# Patient Record
Sex: Male | Born: 1965 | Race: White | Hispanic: No | Marital: Single | Smoking: Never smoker
Health system: Southern US, Community
[De-identification: ages and names within clinical notes are randomized; demographics above are authoritative.]

## PROBLEM LIST (undated history)

## (undated) DIAGNOSIS — E785 Hyperlipidemia, unspecified: Secondary | ICD-10-CM

## (undated) HISTORY — PX: TONSILLECTOMY: SUR1361

## (undated) HISTORY — PX: COLONOSCOPY: SHX174

## (undated) HISTORY — DX: Hyperlipidemia, unspecified: E78.5

---

## 2003-02-02 ENCOUNTER — Emergency Department (HOSPITAL_COMMUNITY): Admission: EM | Admit: 2003-02-02 | Discharge: 2003-02-03 | Payer: Self-pay | Admitting: Emergency Medicine

## 2003-02-03 ENCOUNTER — Encounter: Payer: Self-pay | Admitting: Emergency Medicine

## 2019-01-28 ENCOUNTER — Other Ambulatory Visit: Payer: Self-pay

## 2019-01-29 LAB — URINALYSIS, ROUTINE W REFLEX MICROSCOPIC
Bilirubin, UA: NEGATIVE
Glucose, UA: NEGATIVE
Ketones, UA: NEGATIVE
Leukocytes,UA: NEGATIVE
Nitrite, UA: NEGATIVE
Protein,UA: NEGATIVE
RBC, UA: NEGATIVE
Specific Gravity, UA: <=1.005 — AB (ref 1.005–1.030)
Urobilinogen, Ur: 0.2 mg/dL (ref 0.2–1.0)
pH, UA: 7 (ref 5.0–7.5)

## 2019-01-30 LAB — CBC WITH DIFFERENTIAL/PLATELET
Basophils Absolute: 0.1 10*3/uL (ref 0.0–0.2)
Basos: 1 %
EOS (ABSOLUTE): 0 10*3/uL (ref 0.0–0.4)
Eos: 0 %
Hematocrit: 49.8 % (ref 37.5–51.0)
Hemoglobin: 17.1 g/dL (ref 13.0–17.7)
Immature Grans (Abs): 0 10*3/uL (ref 0.0–0.1)
Immature Granulocytes: 0 %
Lymphocytes Absolute: 2 10*3/uL (ref 0.7–3.1)
Lymphs: 23 %
MCH: 28.5 pg (ref 26.6–33.0)
MCHC: 34.3 g/dL (ref 31.5–35.7)
MCV: 83 fL (ref 79–97)
Monocytes Absolute: 0.8 10*3/uL (ref 0.1–0.9)
Monocytes: 9 %
Neutrophils Absolute: 6 10*3/uL (ref 1.4–7.0)
Neutrophils: 67 %
Platelets: 210 10*3/uL (ref 150–450)
RBC: 6 x10E6/uL — ABNORMAL HIGH (ref 4.14–5.80)
RDW: 13 % (ref 11.6–15.4)
WBC: 9 10*3/uL (ref 3.4–10.8)

## 2019-01-30 LAB — ABO/RH: Rh Factor: NEGATIVE

## 2019-01-30 LAB — COMPREHENSIVE METABOLIC PANEL
ALT: 20 IU/L (ref 0–44)
AST: 23 IU/L (ref 0–40)
Albumin/Globulin Ratio: 2.7 — ABNORMAL HIGH (ref 1.2–2.2)
Albumin: 4.8 g/dL (ref 3.8–4.9)
Alkaline Phosphatase: 58 IU/L (ref 39–117)
BUN/Creatinine Ratio: 18 (ref 9–20)
BUN: 15 mg/dL (ref 6–24)
Bilirubin Total: 1 mg/dL (ref 0.0–1.2)
CO2: 23 mmol/L (ref 20–29)
Calcium: 9.1 mg/dL (ref 8.7–10.2)
Chloride: 94 mmol/L — ABNORMAL LOW (ref 96–106)
Creatinine, Ser: 0.85 mg/dL (ref 0.76–1.27)
GFR calc Af Amer: 117 mL/min/{1.73_m2} (ref >59)
GFR calc non Af Amer: 101 mL/min/{1.73_m2} (ref >59)
Globulin, Total: 1.8 g/dL (ref 1.5–4.5)
Glucose: 87 mg/dL (ref 65–99)
Potassium: 4.3 mmol/L (ref 3.5–5.2)
Sodium: 134 mmol/L (ref 134–144)
Total Protein: 6.6 g/dL (ref 6.0–8.5)

## 2019-01-30 LAB — TSH: TSH: 1.47 u[IU]/mL (ref 0.450–4.500)

## 2019-01-30 LAB — HGB A1C W/O EAG: Hgb A1c MFr Bld: 5.1 % (ref 4.8–5.6)

## 2019-01-30 LAB — LIPID PANEL WITH LDL/HDL RATIO
Cholesterol, Total: 186 mg/dL (ref 100–199)
HDL: 46 mg/dL (ref >39)
LDL Calculated: 130 mg/dL — ABNORMAL HIGH (ref 0–99)
LDl/HDL Ratio: 2.8 ratio (ref 0.0–3.6)
Triglycerides: 52 mg/dL (ref 0–149)
VLDL Cholesterol Cal: 10 mg/dL (ref 5–40)

## 2019-01-30 LAB — VITAMIN D 25 HYDROXY (VIT D DEFICIENCY, FRACTURES): Vit D, 25-Hydroxy: 33.9 ng/mL (ref 30.0–100.0)

## 2019-01-30 LAB — ANA: Anti Nuclear Antibody (ANA): NEGATIVE

## 2019-01-30 LAB — RHEUMATOID FACTOR: Rhuematoid fact SerPl-aCnc: <10 IU/mL (ref 0.0–13.9)

## 2019-01-30 LAB — SEDIMENTATION RATE: Sed Rate: 2 mm/hr (ref 0–30)

## 2019-01-30 LAB — TESTOSTERONE: Testosterone: 302 ng/dL (ref 264–916)

## 2019-01-30 LAB — PSA: Prostate Specific Ag, Serum: 0.8 ng/mL (ref 0.0–4.0)

## 2021-02-02 ENCOUNTER — Other Ambulatory Visit (HOSPITAL_BASED_OUTPATIENT_CLINIC_OR_DEPARTMENT_OTHER): Payer: Self-pay | Admitting: Internal Medicine

## 2021-02-02 ENCOUNTER — Other Ambulatory Visit: Payer: Self-pay | Admitting: Internal Medicine

## 2021-02-02 DIAGNOSIS — R3129 Other microscopic hematuria: Secondary | ICD-10-CM

## 2021-02-15 ENCOUNTER — Other Ambulatory Visit: Payer: Self-pay

## 2021-02-15 ENCOUNTER — Ambulatory Visit
Admission: RE | Admit: 2021-02-15 | Discharge: 2021-02-15 | Disposition: A | Discharge status: Home / Self Care | Payer: BC Managed Care – PPO | Source: Ambulatory Visit | Attending: Internal Medicine | Admitting: Internal Medicine

## 2021-02-15 DIAGNOSIS — R3129 Other microscopic hematuria: Secondary | ICD-10-CM | POA: Diagnosis not present

## 2021-02-22 ENCOUNTER — Ambulatory Visit
Admission: RE | Admit: 2021-02-22 | Discharge: 2021-02-22 | Disposition: A | Discharge status: Home / Self Care | Payer: BC Managed Care – PPO | Source: Ambulatory Visit | Attending: Urology | Admitting: Urology

## 2021-02-22 ENCOUNTER — Other Ambulatory Visit: Payer: Self-pay

## 2021-02-22 ENCOUNTER — Encounter: Payer: Self-pay | Admitting: Urology

## 2021-02-22 ENCOUNTER — Ambulatory Visit (INDEPENDENT_AMBULATORY_CARE_PROVIDER_SITE_OTHER): Payer: BC Managed Care – PPO | Admitting: Urology

## 2021-02-22 VITALS — BP 156/96 | HR 61 | Ht 69.0 in | Wt 180.0 lb

## 2021-02-22 DIAGNOSIS — N200 Calculus of kidney: Secondary | ICD-10-CM | POA: Diagnosis present

## 2021-02-22 DIAGNOSIS — R3129 Other microscopic hematuria: Secondary | ICD-10-CM

## 2021-02-22 LAB — URINALYSIS, COMPLETE
Bilirubin, UA: NEGATIVE
Glucose, UA: NEGATIVE
Ketones, UA: NEGATIVE
Leukocytes,UA: NEGATIVE
Nitrite, UA: NEGATIVE
Protein,UA: NEGATIVE
Specific Gravity, UA: <1.005 — ABNORMAL LOW (ref 1.005–1.030)
Urobilinogen, Ur: 0.2 mg/dL (ref 0.2–1.0)
pH, UA: 6.5 (ref 5.0–7.5)

## 2021-02-22 LAB — MICROSCOPIC EXAMINATION
Bacteria, UA: NONE SEEN
Epithelial Cells (non renal): NONE SEEN /hpf (ref 0–10)

## 2021-02-22 NOTE — Patient Instructions (Signed)
Cystoscopy Cystoscopy is a procedure that is used to help diagnose and sometimes treat conditions that affect the lower urinary tract. The lower urinary tract includes the bladder and the urethra. The urethra is the tube that drains urine from the bladder. Cystoscopy is done using a thin, tube-shaped instrument with a light and camera at the end (cystoscope). The cystoscope may be hard or flexible, depending on the goal of the procedure. The cystoscope is inserted through the urethra, into the bladder. Cystoscopy may be recommended if you have: Urinary tract infections that keep coming back. Blood in the urine (hematuria). An inability to control when you urinate (urinary incontinence) or an overactive bladder. Unusual cells found in a urine sample. A blockage in the urethra, such as a urinary stone. Painful urination. An abnormality in the bladder found during an intravenous pyelogram (IVP) or CT scan. Cystoscopy may also be done to remove a sample of tissue to be examined under a microscope (biopsy). What are the risks? Generally, this is a safe procedure. However, problems may occur, including: Infection. Bleeding.  What happens during the procedure?  You will be given one or more of the following: A medicine to numb the area (local anesthetic). The area around the opening of your urethra will be cleaned. The cystoscope will be passed through your urethra into your bladder. Germ-free (sterile) fluid will flow through the cystoscope to fill your bladder. The fluid will stretch your bladder so that your health care provider can clearly examine your bladder walls. Your doctor will look at the urethra and bladder. The cystoscope will be removed The procedure may vary among health care providers  What can I expect after the procedure? After the procedure, it is common to have: Some soreness or pain in your abdomen and urethra. Urinary symptoms. These include: Mild pain or burning when you  urinate. Pain should stop within a few minutes after you urinate. This may last for up to 1 week. A small amount of blood in your urine for several days. Feeling like you need to urinate but producing only a small amount of urine. Follow these instructions at home: General instructions Return to your normal activities as told by your health care provider.  Do not drive for 24 hours if you were given a sedative during your procedure. Watch for any blood in your urine. If the amount of blood in your urine increases, call your health care provider. If a tissue sample was removed for testing (biopsy) during your procedure, it is up to you to get your test results. Ask your health care provider, or the department that is doing the test, when your results will be ready. Drink enough fluid to keep your urine pale yellow. Keep all follow-up visits as told by your health care provider. This is important. Contact a health care provider if you: Have pain that gets worse or does not get better with medicine, especially pain when you urinate. Have trouble urinating. Have more blood in your urine. Get help right away if you: Have blood clots in your urine. Have abdominal pain. Have a fever or chills. Are unable to urinate. Summary Cystoscopy is a procedure that is used to help diagnose and sometimes treat conditions that affect the lower urinary tract. Cystoscopy is done using a thin, tube-shaped instrument with a light and camera at the end. After the procedure, it is common to have some soreness or pain in your abdomen and urethra. Watch for any blood in your urine.   If the amount of blood in your urine increases, call your health care provider. If you were prescribed an antibiotic medicine, take it as told by your health care provider. Do not stop taking the antibiotic even if you start to feel better. This information is not intended to replace advice given to you by your health care provider. Make  sure you discuss any questions you have with your health care provider. Document Revised: 05/20/2018 Document Reviewed: 05/20/2018 Elsevier Patient Education  2020 [employer]  

## 2021-02-22 NOTE — Progress Notes (Signed)
02/24/21 2:11 PM   Craig Acosta 12-Mar-1966 [id number]  Referring provider:  Marguarite Arbour, MD [address] [facility] [place],  [place] [address] Chief Complaint  Patient presents with   Nephrolithiasis    New Patient     HPI: Craig Acosta is a 55 y.o.male who presents today for further evaluation of calculus of kidney.   He saw his PCP last month and there was noted mild hematuria, incidental. His last urinalysis on 01/23/2021 showed moderate blood with 10-50 RBCs.   On 02/15/2021 he received a RUS due to his microscopic hematuria. The RUS revealed a nonobstructive right upper pole renal calculi, largest measured up to 6 mm with no hydronephrosis.   Urine today shows no blood.    He reports today that he has no urinary problems and has not seen blood in urine. He does not have a past history of kidney stones he states that he has adequate water intake.   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   He has a family history of prostate cancer.   PMH: Past Medical History:  Diagnosis Date   Hyperlipidemia     Surgical History: Past Surgical History:  Procedure Laterality Date   COLONOSCOPY     TONSILLECTOMY      Home Medications:  Allergies as of 02/22/2021   No Known Allergies      Medication List        Accurate as of February 22, 2021 11:59 PM. If you have any questions, ask your nurse or doctor.          D3-1000 PO Take by mouth.   Fish Oil 1000 MG Cpdr Take by mouth.   PROBIOTIC-10 ULTIMATE PO Take by mouth.        Allergies: No Known Allergies  Family History: History reviewed. No pertinent family history.  Social History:  reports that he has never smoked. He has never used smokeless tobacco. He reports that he does not currently use alcohol. No history on file for drug use.   Physical Exam: BP (!) 156/96   Pulse 61   Ht 5\' 9"  (1.753 m)   Wt  180 lb (81.6 kg)   BMI 26.58 kg/m   Constitutional:  Alert and oriented, No acute distress. HEENT: [place] AT, moist mucus membranes.  Trachea midline, no masses. Cardiovascular: No clubbing, cyanosis, or edema. Respiratory: Normal respiratory effort, no increased work of breathing. Skin: No rashes, bruises or suspicious lesions. Neurologic: Grossly intact, no focal deficits, moving all 4 extremities. Psychiatric: Normal mood and affect.  Laboratory Data:  Lab Results  Component Value Date   CREATININE 0.85 01/28/2019     Lab Results  Component Value Date   TESTOSTERONE 302 01/28/2019    Lab Results  Component Value Date   HGBA1C 5.1 01/28/2019    Urinalysis - Unremarkable with no blood today   Pertinent Imaging: CLINICAL DATA:  Microscopic hematuria   EXAM: RENAL / URINARY TRACT ULTRASOUND COMPLETE   COMPARISON:  None.   FINDINGS: Right Kidney:   Renal measurements: 10.3 x 5.5 x 5.3 cm = volume: 157.2 mL. No hydronephrosis. There are multiple shadowing renal calculi upper pole, largest measuring 6 mm.   Left Kidney:   Renal measurements: 10.1 x 5.4 x 5.6 cm = volume: 159.1 mL. No hydronephrosis or nephrolithiasis.   Bladder:   Appears normal for degree of bladder distention. Bilateral  ureteral jets are seen.   Other:   None.   IMPRESSION: Nonobstructive right upper pole renal calculi, largest measuring up to 6 mm. No hydronephrosis.     Electronically Signed   By: Caprice Renshaw M.D.   On: 02/17/2021 17:17   I have personally reviewed the images and agree with radiologist interpretation.    Assessment & Plan:   Kidney stones  - asymptomatic  - nonobstructive, multiple  - KUB ordered to better understand stone size and location to see if he needs any further intervention - offered treatment versus surveillance    2. Microscopic hematuria  - urinalysis at primary care 10-50 RBCs  - urine today had no blood  - Typically do not see microscopic  blood with nonobstructive stones would recommend cystoscopy. He is agreeable with this plan  Return for Will call with results.  Tawni Millers as a Neurosurgeon for Craig Scotland, MD.,have documented all relevant documentation on the behalf of Craig Scotland, MD,as directed by  Craig Scotland, MD while in the presence of Craig Scotland, MD.  I have reviewed the above documentation for accuracy and completeness, and I agree with the above.   Craig Scotland, MD   [facility] [address], [address] [place], [place] [address] [phone]

## 2021-02-23 ENCOUNTER — Telehealth: Payer: Self-pay

## 2021-02-23 NOTE — Telephone Encounter (Signed)
Pt returned your call and wants you to call him back [phone]

## 2021-02-23 NOTE — Telephone Encounter (Signed)
Pt LMOM that he wants to get his cysto done under anesthesia.  He said if he needs to cancel current appt to schedule surgery, he could do that.

## 2021-02-23 NOTE — Telephone Encounter (Signed)
Returned patients called regarding VM left stating pt had questions about cystoscopy. I left pt vm to return my call

## 2021-02-23 NOTE — Telephone Encounter (Signed)
-----   Message from [name], MD sent at 02/23/2021  1:37 PM EDT ----- No stones on this x ray.  The stones from the ultrasound are not seen.  Either they don't exist or are obscured by bowel.  We can talk about whether to do any further imaging at your follow up.  [name], MD

## 2021-02-24 ENCOUNTER — Encounter: Payer: Self-pay | Admitting: Urology

## 2021-02-24 MED ORDER — DIAZEPAM 10 MG PO TABS: 10.0000 mg | ORAL_TABLET | Freq: Once | ORAL | 0 refills | Status: AC

## 2021-02-24 NOTE — Addendum Note (Signed)
Addended by: [name] on: 02/24/2021 02:15 PM   Modules accepted: Orders

## 2021-02-24 NOTE — Telephone Encounter (Signed)
Please let him know that we can offer him a valium in the office if he has a drive to reduce anxiety.  I sent it to the pharmacy.   Cysto in the OR $$$$.    [name], MD

## 2021-02-24 NOTE — Telephone Encounter (Signed)
Sw pt. OK with taking valium. I explained procedure to patient and assured him . Pt verbalized understanding and ok with plan

## 2021-03-16 ENCOUNTER — Other Ambulatory Visit: Payer: BC Managed Care – PPO | Admitting: Urology

## 2022-08-09 IMAGING — US US RENAL
1 series · 14 of 25 positions shown · non-contrast
Comparison: None.

CLINICAL DATA: Microscopic hematuria

EXAM:
RENAL / URINARY TRACT ULTRASOUND COMPLETE

[Series 1: us renal · 0.23mm/px · 14 of 42 slices shown]
[im 1/42]
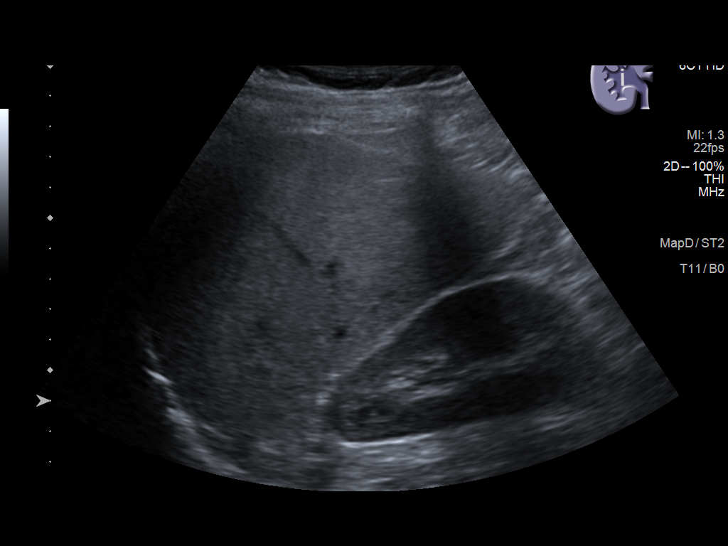
[im 4/42]
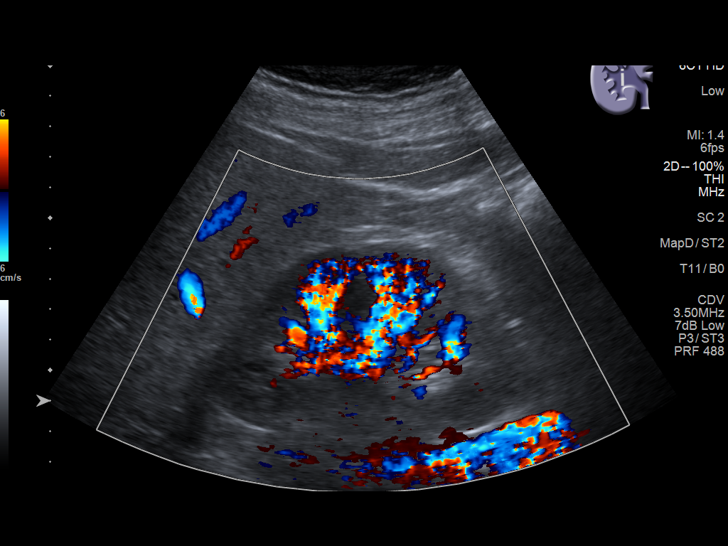
[im 7/42]
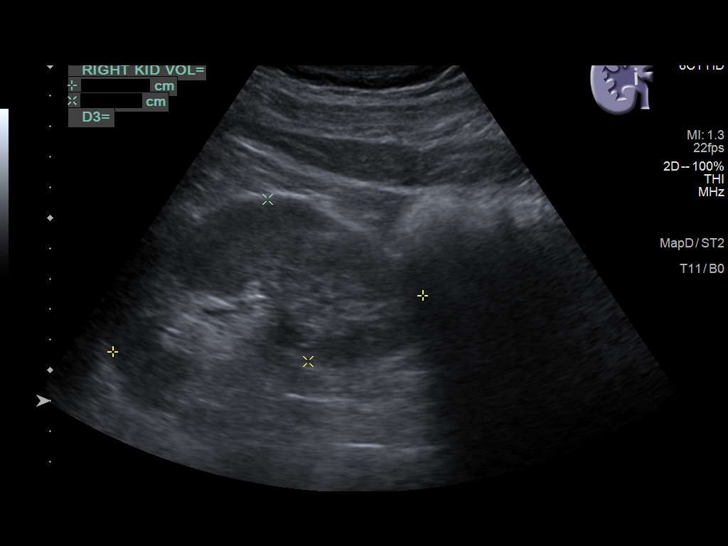
[im 11/42]
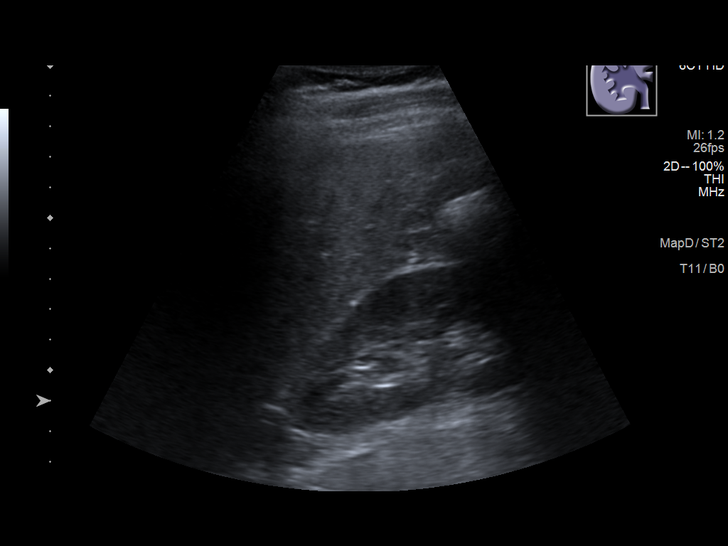
[im 14/42]
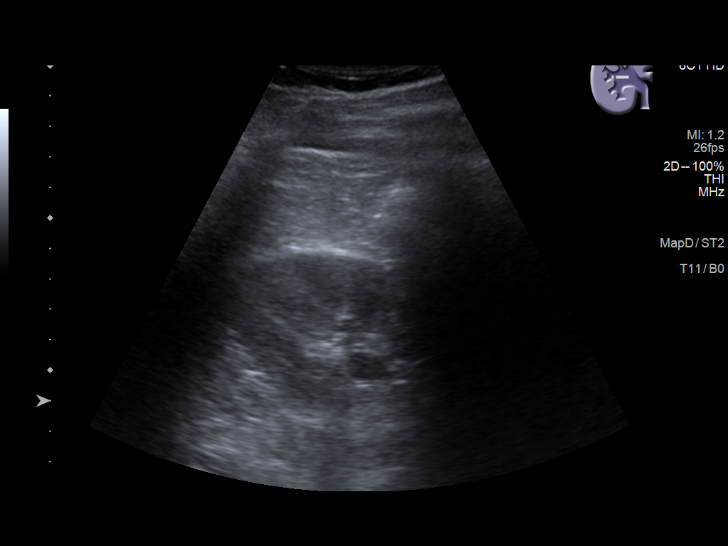
[im 16/42]
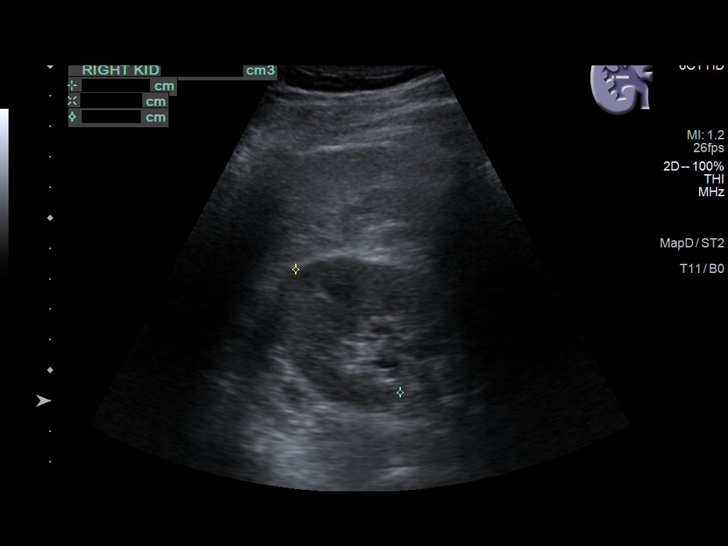
[im 19/42]
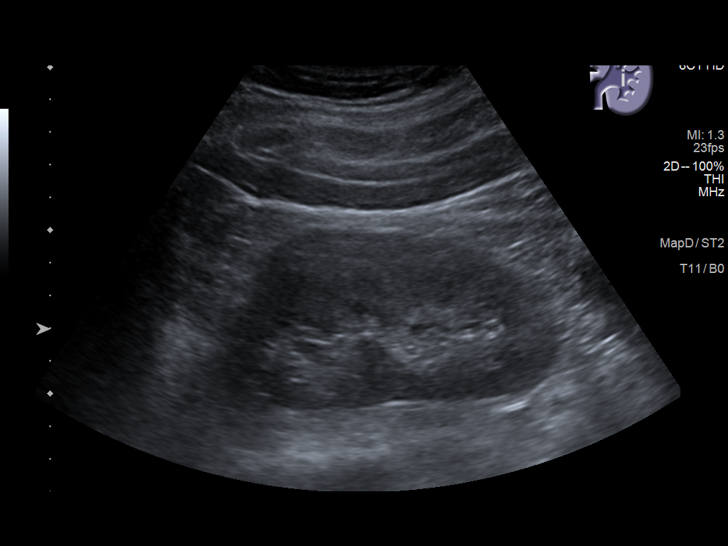
[im 23/42]
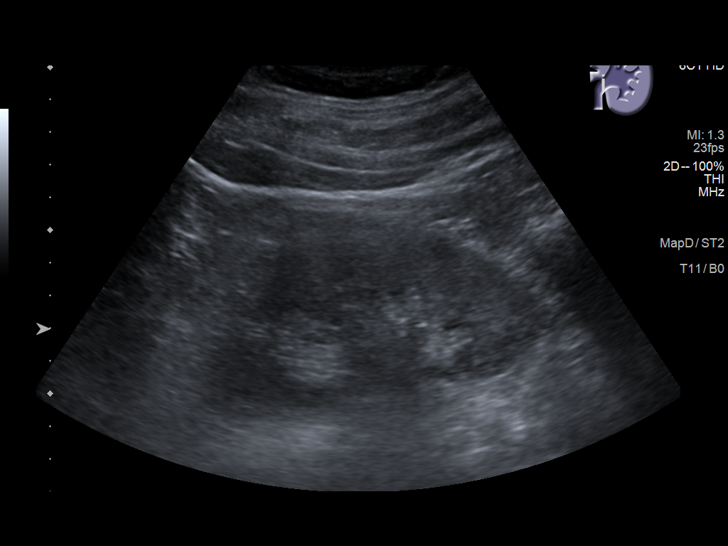
[im 26/42]
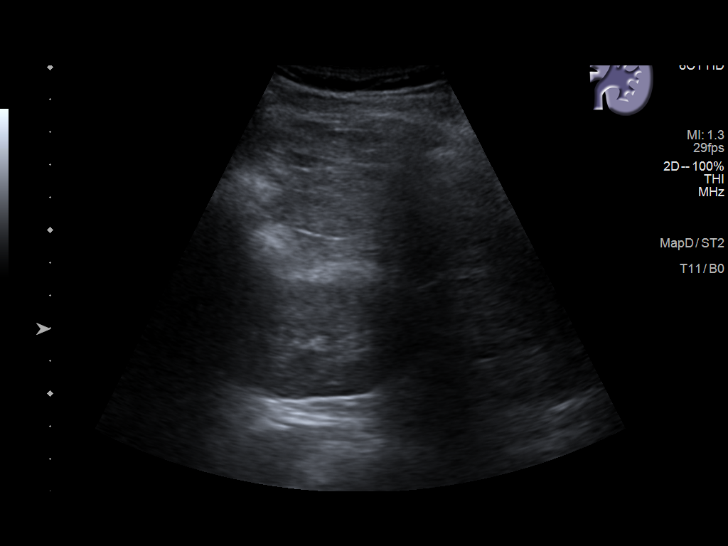
[im 28/42]
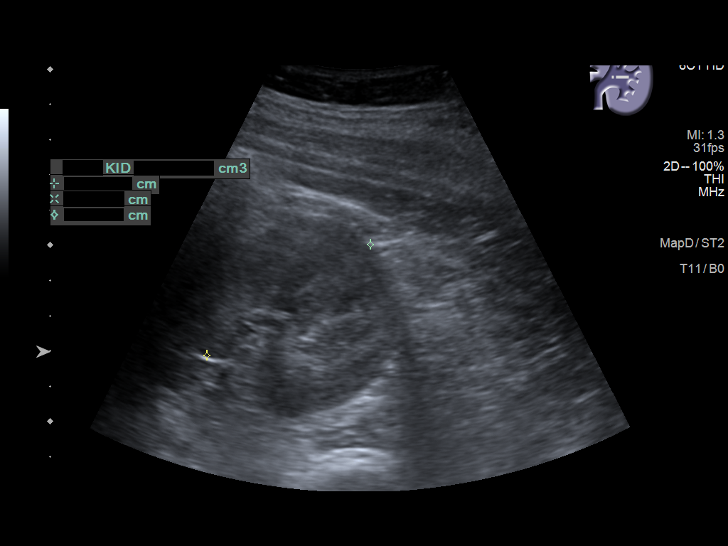
[im 31/42]
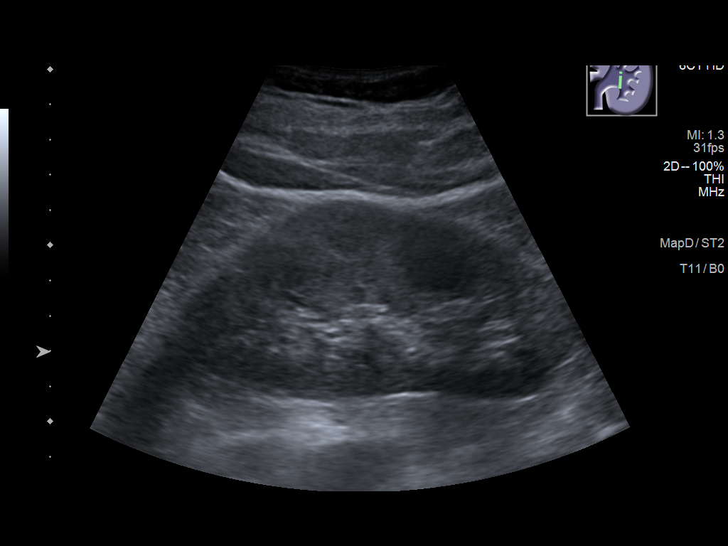
[im 35/42]
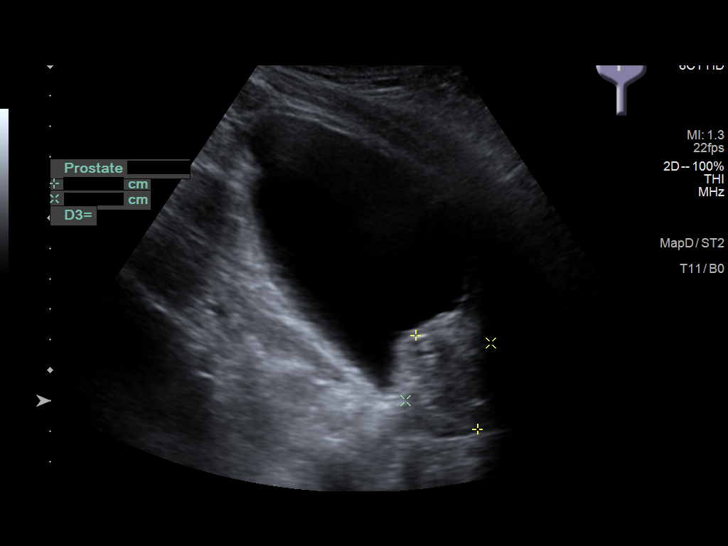
[im 38/42]
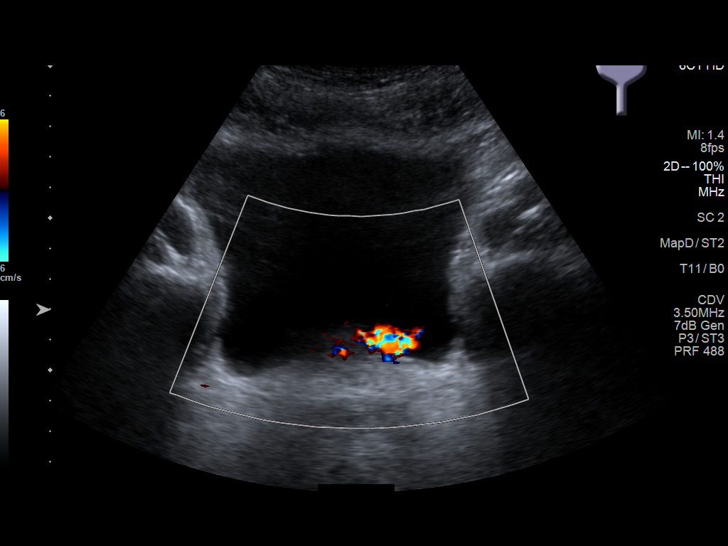
[im 42/42]
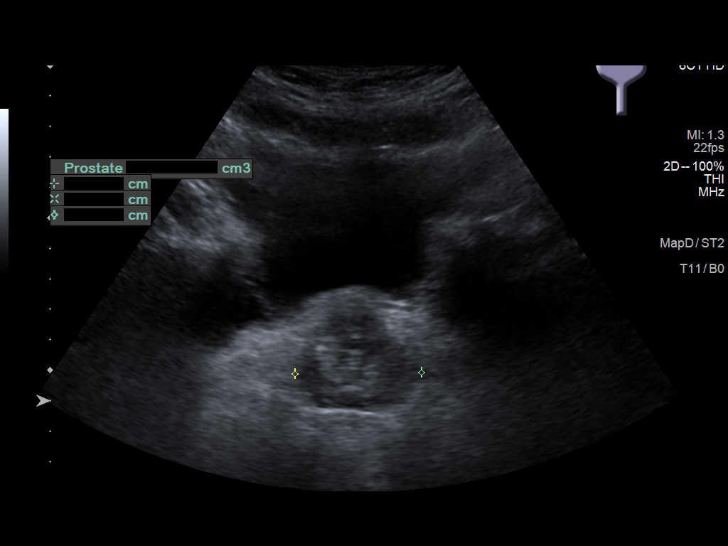

[14 of 25 positions shown; findings below may reference images not displayed]

FINDINGS: Right Kidney:

Renal measurements: 10.3 x 5.5 x 5.3 cm = volume: 157.2 mL. No
hydronephrosis. There are multiple shadowing renal calculi upper
pole, largest measuring 6 mm.

Left Kidney:

Renal measurements: 10.1 x 5.4 x 5.6 cm = volume: 159.1 mL. No
hydronephrosis or nephrolithiasis.

Bladder:

Appears normal for degree of bladder distention. Bilateral ureteral
jets are seen.

Other:

None.
IMPRESSION: Nonobstructive right upper pole renal calculi, largest measuring up
to 6 mm. No hydronephrosis.

## 2023-02-08 ENCOUNTER — Other Ambulatory Visit: Payer: Self-pay

## 2023-02-08 ENCOUNTER — Emergency Department: Payer: BC Managed Care – PPO

## 2023-02-08 ENCOUNTER — Emergency Department
Admission: EM | Admit: 2023-02-08 | Discharge: 2023-02-08 | Disposition: A | Discharge status: Home / Self Care | Payer: BC Managed Care – PPO | Attending: Emergency Medicine | Admitting: Emergency Medicine

## 2023-02-08 DIAGNOSIS — K8500 Idiopathic acute pancreatitis without necrosis or infection: Secondary | ICD-10-CM | POA: Insufficient documentation

## 2023-02-08 DIAGNOSIS — R1010 Upper abdominal pain, unspecified: Secondary | ICD-10-CM | POA: Diagnosis present

## 2023-02-08 LAB — CBC
HCT: 48.3 % (ref 39.0–52.0)
Hemoglobin: 16.8 g/dL (ref 13.0–17.0)
MCH: 28.4 pg (ref 26.0–34.0)
MCHC: 34.8 g/dL (ref 30.0–36.0)
MCV: 81.6 fL (ref 80.0–100.0)
Platelets: 206 10*3/uL (ref 150–400)
RBC: 5.92 MIL/uL — ABNORMAL HIGH (ref 4.22–5.81)
RDW: 12.5 % (ref 11.5–15.5)
WBC: 15.8 10*3/uL — ABNORMAL HIGH (ref 4.0–10.5)
nRBC: 0 % (ref 0.0–0.2)

## 2023-02-08 LAB — COMPREHENSIVE METABOLIC PANEL
ALT: 29 U/L (ref 0–44)
AST: 21 U/L (ref 15–41)
Albumin: 4.6 g/dL (ref 3.5–5.0)
Alkaline Phosphatase: 68 U/L (ref 38–126)
Anion gap: 9 (ref 5–15)
BUN: 15 mg/dL (ref 6–20)
CO2: 26 mmol/L (ref 22–32)
Calcium: 9 mg/dL (ref 8.9–10.3)
Chloride: 101 mmol/L (ref 98–111)
Creatinine, Ser: 0.94 mg/dL (ref 0.61–1.24)
GFR, Estimated: >60 mL/min (ref >60)
Glucose, Bld: 91 mg/dL (ref 70–99)
Potassium: 4.3 mmol/L (ref 3.5–5.1)
Sodium: 136 mmol/L (ref 135–145)
Total Bilirubin: 0.8 mg/dL (ref 0.3–1.2)
Total Protein: 7.4 g/dL (ref 6.5–8.1)

## 2023-02-08 LAB — URINALYSIS, ROUTINE W REFLEX MICROSCOPIC
Bacteria, UA: NONE SEEN
Bilirubin Urine: NEGATIVE
Glucose, UA: NEGATIVE mg/dL
Ketones, ur: NEGATIVE mg/dL
Leukocytes,Ua: NEGATIVE
Nitrite: NEGATIVE
Protein, ur: NEGATIVE mg/dL
Specific Gravity, Urine: 1.005 (ref 1.005–1.030)
Squamous Epithelial / HPF: NONE SEEN /HPF (ref 0–5)
WBC, UA: NONE SEEN WBC/hpf (ref 0–5)
pH: 7 (ref 5.0–8.0)

## 2023-02-08 LAB — LIPASE, BLOOD: Lipase: 296 U/L — ABNORMAL HIGH (ref 11–51)

## 2023-02-08 MED ORDER — ONDANSETRON 4 MG PO TBDP: 4.0000 mg | ORAL_TABLET | Freq: Three times a day (TID) | ORAL | 0 refills | Status: AC | PRN

## 2023-02-08 MED ORDER — IBUPROFEN 600 MG PO TABS
600.0000 mg | ORAL_TABLET | Freq: Once | ORAL | Status: AC
  Administered 2023-02-08: 600 mg via ORAL
  Filled 2023-02-08: qty 1

## 2023-02-08 MED ORDER — OXYCODONE-ACETAMINOPHEN 5-325 MG PO TABS: 1.0000 | ORAL_TABLET | ORAL | 0 refills | Status: AC | PRN

## 2023-02-08 NOTE — ED Notes (Signed)
Pt given labelled UA cup and instructed on use for sample

## 2023-02-08 NOTE — ED Triage Notes (Signed)
Pt c/o abdominal pain since yesterday, denies unusual food. Pt tried pepto bismol and tylenol w/ out relief. Pt c/o pain across the lower abdomen. Pt has appendix and gallbladder. Pt denies N/V/D.

## 2023-02-08 NOTE — Discharge Instructions (Addendum)
Take the Percocet for pain (or over-the-counter Tylenol) and the Zofran as needed for nausea.  You should do a clear liquid diet for the next 1 to 2 days, and then around Sunday can resume solid food.  You should eat small meals more frequently throughout the day with as little fat as possible.  Follow-up with your primary care doctor in the next 1 to 2 weeks.  Return to the ER for new, worsening, or persistent severe abdominal pain, vomiting, fever or chills, weakness, or any other new or worsening symptoms that concern you.

## 2023-02-08 NOTE — ED Provider Notes (Signed)
[facility] Provider Note    Event Date/Time   First MD Initiated Contact with Patient 02/08/23 1151     (approximate)   History   Abdominal Pain   HPI  XAVYER SULEWSKI is a 57 y.o. male with a history of hyperlipidemia, hypogonadism, and kidney stones who presents with upper abdominal pain since yesterday, acute onset, bilateral, worse with certain movements or when he takes [place].  He does not radiate.  He does not have any associated nausea or vomiting.  He has not had a bowel movement today.  He denies prior history of this pain.  He states he has not been eating anything out of the ordinary.  He denies drinking alcohol.  I the past medical records.  The patient's most recent outpatient encounter was on 8/29 of last year for an annual physical exam.  He has no recent ED visits or admissions.   Physical Exam   Triage Vital Signs: ED Triage Vitals  Encounter Vitals Group     BP 02/08/23 1132 (!) 179/98     Systolic BP Percentile --      Diastolic BP Percentile --      Pulse Rate 02/08/23 1132 76     Resp 02/08/23 1132 18     Temp 02/08/23 1132 98.5 F (36.9 C)     Temp Source 02/08/23 1132 Oral     SpO2 02/08/23 1132 100 %     Weight 02/08/23 1211 179 lb 14.3 oz (81.6 kg)     Height 02/08/23 1211 5\' 9"  (1.753 m)     Head Circumference --      Peak Flow --      Pain Score 02/08/23 1132 5     Pain Loc --      Pain Education --      Exclude from Growth Chart --     Most recent vital signs: Vitals:   02/08/23 1132  BP: (!) 179/98  Pulse: 76  Resp: 18  Temp: 98.5 F (36.9 C)  SpO2: 100%     General: Alert, no distress.  CV:  Good peripheral perfusion.  Resp:  Normal effort.  Abd:  Soft with mild epigastric tenderness.  No distention.  Other:  No jaundice or scleral icterus.   ED Results / Procedures / Treatments   Labs (all labs ordered are listed, but only abnormal results are displayed) Labs Reviewed  LIPASE, BLOOD - Abnormal;  Notable for the following components:      Result Value   Lipase 296 (*)    All other components within normal limits  CBC - Abnormal; Notable for the following components:   WBC 15.8 (*)    RBC 5.92 (*)    All other components within normal limits  URINALYSIS, ROUTINE W REFLEX MICROSCOPIC - Abnormal; Notable for the following components:   Color, Urine STRAW (*)    APPearance CLEAR (*)    Hgb urine dipstick MODERATE (*)    All other components within normal limits  COMPREHENSIVE METABOLIC PANEL     EKG     RADIOLOGY  CT abdomen/pelvis: I independently viewed and interpreted the images; there are no dilated bowel loops or any free air or free fluid.  Radiology report indicates acute uncomplicated pancreatitis.  US abdomen RUQ: No gallstones or other acute abnormality  PROCEDURES:  Critical Care performed: No  Procedures   MEDICATIONS ORDERED IN ED: Medications  ibuprofen (ADVIL) tablet 600 mg (600 mg Oral Given 02/08/23 1311)  IMPRESSION / MDM / ASSESSMENT AND PLAN / ED COURSE  I reviewed the triage vital signs and the nursing notes.  57 year old male with PMH as noted above presents with acute onset of upper abdominal pain since yesterday with no significant associated symptoms.  The patient denies alcohol use.  Differential diagnosis includes, but is not limited to, gastritis, gastroparesis, PUD, gastroenteritis, pancreatitis, biliary colic, other hepatobiliary cause.  Patient's presentation is most consistent with acute presentation with potential threat to life or bodily function.  Initial lab workup reveals elevated lipase and WBC count consistent with pancreatitis.  Since the patient has no history of alcohol use we will obtain imaging for further evaluation.  ----------------------------------------- 2:43 PM on 02/08/2023 -----------------------------------------  CT shows findings of uncomplicated acute pancreatitis.  Ultrasound shows no evidence of  gallstones or other acute abnormality.  Overall presentation is consistent with idiopathic acute pancreatitis.  The patient's BISAP score is 0.  He has been tolerating p.o., is afebrile, with normal LFTs and no evidence of systemic illness.  He is stable for discharge home.  I counseled him on the results of the workup and plan of care.  I instructed him to take a clear diet for the next 1 to 2 days and then gradually advance.  I gave him strict return precautions and he expresses understanding.  He agrees to follow-up with his primary care provider.   FINAL CLINICAL IMPRESSION(S) / ED DIAGNOSES   Final diagnoses:  Idiopathic acute pancreatitis without infection or necrosis     Rx / DC Orders   ED Discharge Orders          Ordered    oxyCODONE-acetaminophen (PERCOCET) 5-325 MG tablet  Every 4 hours PRN        02/08/23 1442    ondansetron (ZOFRAN-ODT) 4 MG disintegrating tablet  Every 8 hours PRN        02/08/23 1442             Note:  This document was prepared using Dragon voice recognition software and may include unintentional dictation errors.    Dionne Bucy, MD 02/08/23 1444

## 2023-02-08 NOTE — ED Triage Notes (Signed)
Arrives from [facility] for c/o abdominal pain x 1 day.  Denies N/V.
